# Patient Record
Sex: Male | Born: 1980 | Race: White | Hispanic: No | Marital: Single | Smoking: Current every day smoker
Health system: Southern US, Community
[De-identification: ages and names within clinical notes are randomized; demographics above are authoritative.]

## PROBLEM LIST (undated history)

## (undated) ENCOUNTER — Ambulatory Visit: Admission: EM | Source: Home / Self Care

## (undated) DIAGNOSIS — J45909 Unspecified asthma, uncomplicated: Secondary | ICD-10-CM

## (undated) DIAGNOSIS — L732 Hidradenitis suppurativa: Secondary | ICD-10-CM

## (undated) DIAGNOSIS — B958 Unspecified staphylococcus as the cause of diseases classified elsewhere: Secondary | ICD-10-CM

## (undated) DIAGNOSIS — I10 Essential (primary) hypertension: Secondary | ICD-10-CM

## (undated) HISTORY — PX: WISDOM TOOTH EXTRACTION: SHX21

---

## 2000-10-09 ENCOUNTER — Encounter: Payer: Self-pay | Admitting: Emergency Medicine

## 2000-10-09 ENCOUNTER — Emergency Department (HOSPITAL_COMMUNITY): Admission: EM | Admit: 2000-10-09 | Discharge: 2000-10-10 | Payer: Self-pay | Admitting: Emergency Medicine

## 2004-12-09 ENCOUNTER — Ambulatory Visit: Payer: Self-pay | Admitting: Internal Medicine

## 2004-12-09 ENCOUNTER — Ambulatory Visit (HOSPITAL_COMMUNITY): Admission: RE | Admit: 2004-12-09 | Discharge: 2004-12-09 | Payer: Self-pay | Admitting: Internal Medicine

## 2006-02-08 IMAGING — CR DG WRIST COMPLETE 3+V*L*
2 series · 2 of 2 positions shown · non-contrast
Comparison: None.

CLINICAL DATA: Left wrist pain.   Patient lifts heavy furniture. 
 LEFT WRIST ? 3 VIEW:

[view not recorded (1 of 2)]
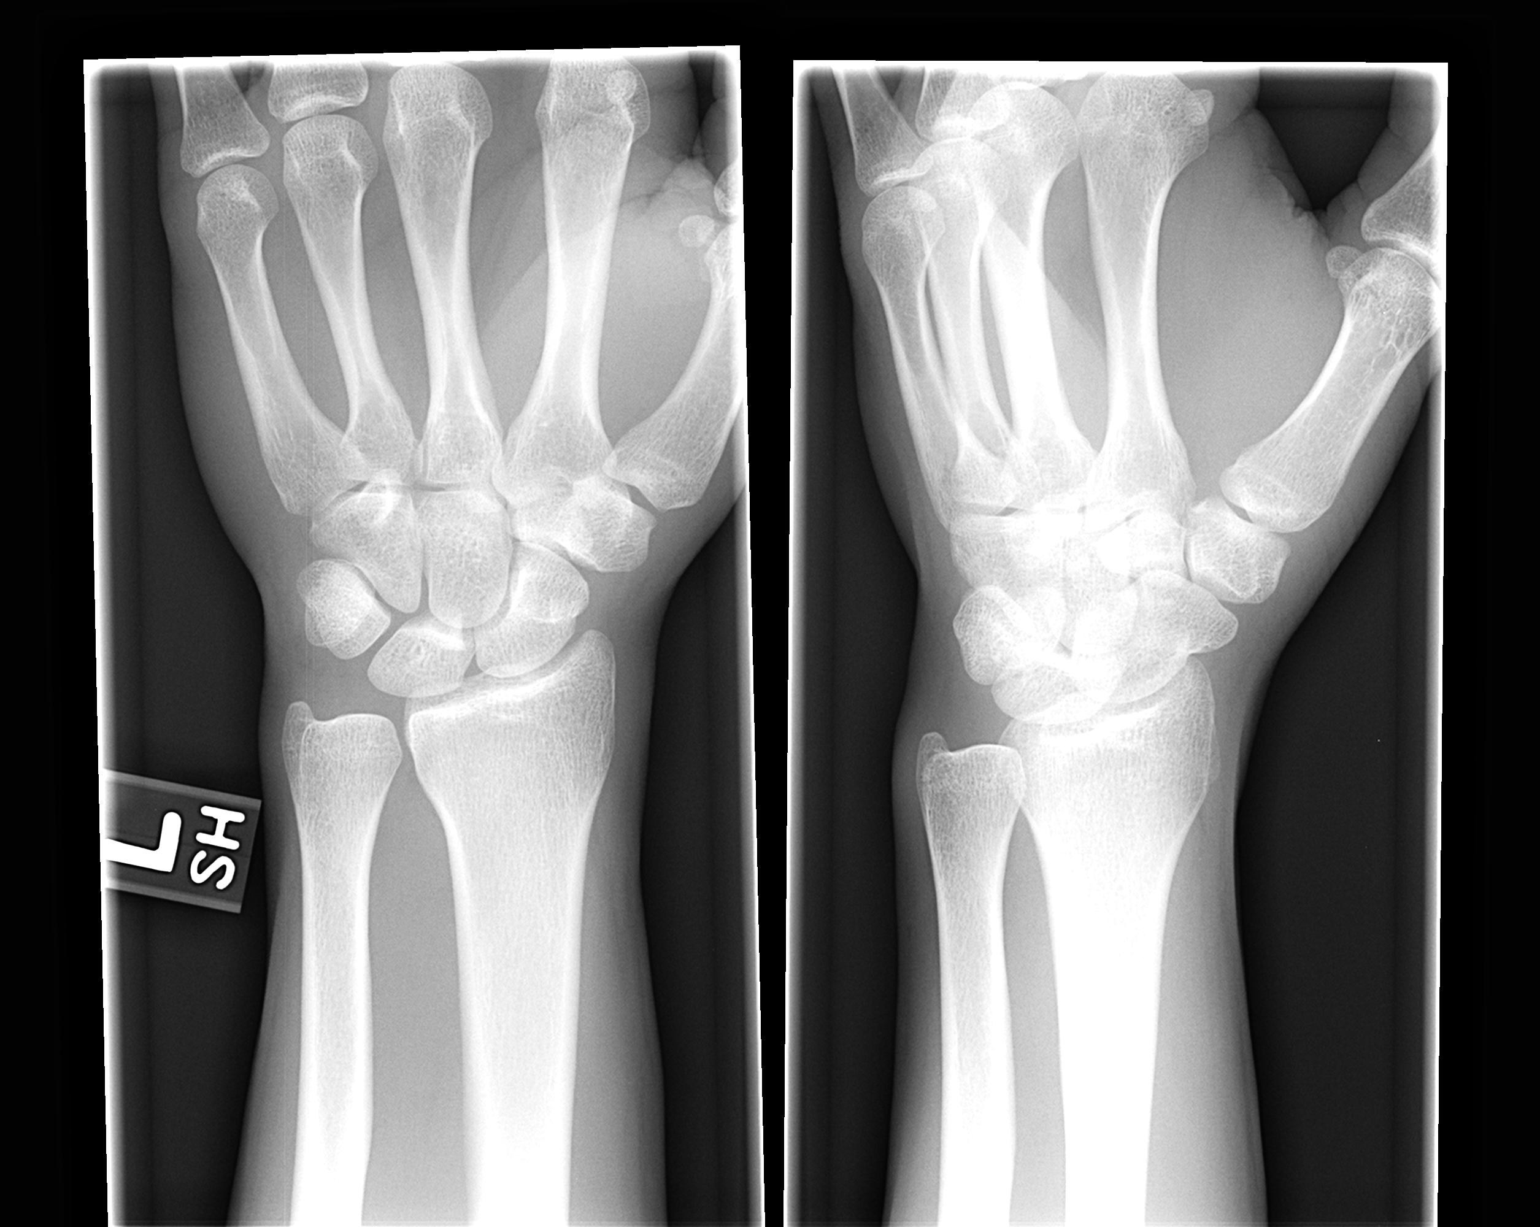

[view not recorded (2 of 2)]
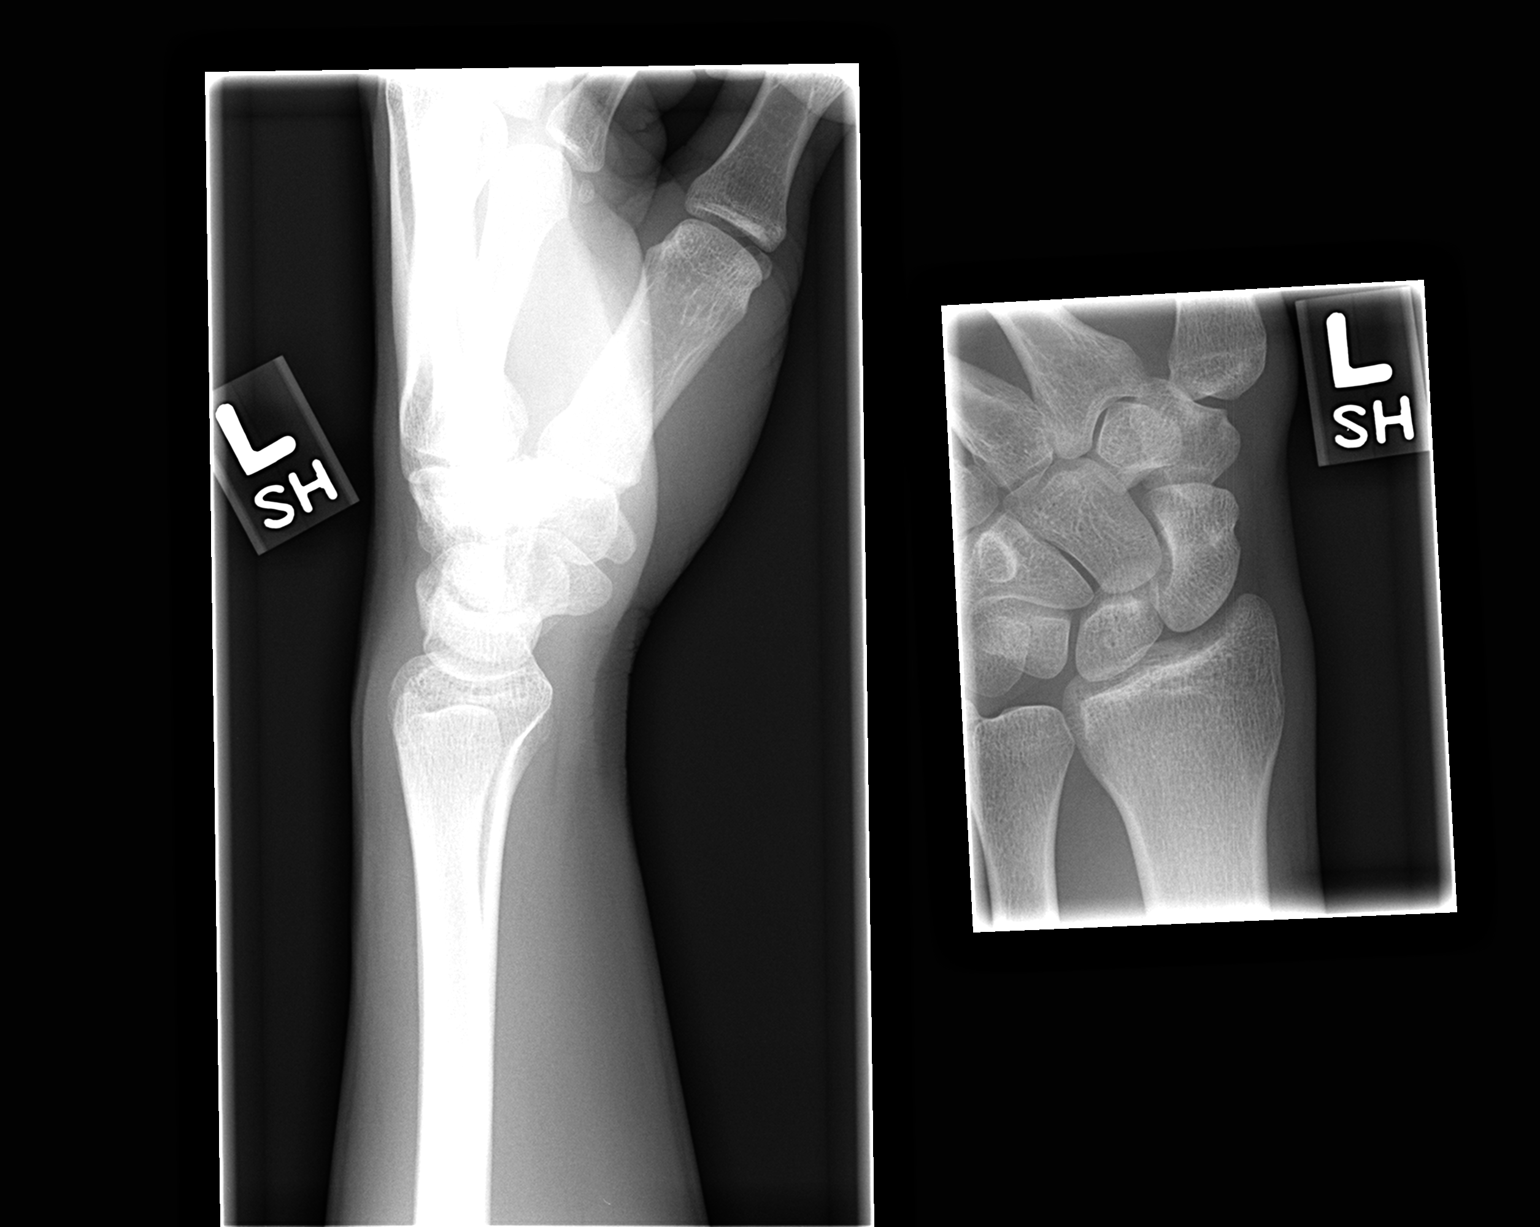

[2 of 2 positions shown; findings below may reference images not displayed]

FINDINGS: No acute radiographic abnormalities are noted.  Specifically, I see no fracture or dislocation.
IMPRESSION: No acute radiographic abnormalities.

## 2008-11-26 ENCOUNTER — Ambulatory Visit: Payer: Self-pay | Admitting: Internal Medicine

## 2008-11-26 DIAGNOSIS — F411 Generalized anxiety disorder: Secondary | ICD-10-CM | POA: Insufficient documentation

## 2008-11-26 DIAGNOSIS — E785 Hyperlipidemia, unspecified: Secondary | ICD-10-CM | POA: Insufficient documentation

## 2008-11-26 DIAGNOSIS — J309 Allergic rhinitis, unspecified: Secondary | ICD-10-CM | POA: Insufficient documentation

## 2009-08-24 ENCOUNTER — Telehealth: Payer: Self-pay | Admitting: Internal Medicine

## 2009-09-11 ENCOUNTER — Ambulatory Visit: Payer: Self-pay | Admitting: Internal Medicine

## 2009-09-11 DIAGNOSIS — M25519 Pain in unspecified shoulder: Secondary | ICD-10-CM

## 2009-12-16 ENCOUNTER — Telehealth: Payer: Self-pay | Admitting: Internal Medicine

## 2010-04-12 ENCOUNTER — Telehealth: Payer: Self-pay | Admitting: Internal Medicine

## 2010-04-20 NOTE — Progress Notes (Signed)
Summary: Med refills  Phone Note Refill Request  on August 24, 2009 2:34 PM  Refills Requested: Medication #1:  ALPRAZOLAM 0.25 MG TABS 1 by mouth two times a day as needed.   Dosage confirmed as above?Dosage Confirmed   Notes: [facility] [address] [phone] Initial call taken by: Scharlene Gloss,  August 24, 2009 2:34 PM  Follow-up for Phone Call        Rx faxed to pharmacy Follow-up by: Margaret Pyle, CMA,  August 24, 2009 3:11 PM    New/Updated Medications: ALPRAZOLAM 0.25 MG TABS (ALPRAZOLAM) 1 by mouth two times a day as needed - please make return office visit for further refills Prescriptions: ALPRAZOLAM 0.25 MG TABS (ALPRAZOLAM) 1 by mouth two times a day as needed - please make return office visit for further refills  #60 x 2   Entered and Authorized by:   Corwin Levins MD   Signed by:   Corwin Levins MD on 08/24/2009   Method used:   Print then Give to Patient   RxID:   [id number]  done hardcopy to LIM side B - dahlia Corwin Levins MD  August 24, 2009 2:49 PM

## 2010-04-20 NOTE — Progress Notes (Signed)
Summary: mediacation refill  Phone Note Refill Request Message from:  Fax from Pharmacy on December 16, 2009 3:50 PM  Refills Requested: Medication #1:  ALPRAZOLAM 0.5 MG TABS 1po three times a day as needed   Dosage confirmed as above?Dosage Confirmed   Last Refilled: 09/11/2009   Notes: [facility], [phone] Initial call taken by: Zella Ball Ewing CMA Duncan Dull),  December 16, 2009 3:51 PM  Follow-up for Phone Call        done hardcopy to LIM side B - dahlia  Follow-up by: Corwin Levins MD,  December 16, 2009 4:03 PM  Additional Follow-up for Phone Call Additional follow up Details #1::        Rx faxed to pharmacy Additional Follow-up by: Margaret Pyle, CMA,  December 16, 2009 4:07 PM    Prescriptions: ALPRAZOLAM 0.5 MG TABS (ALPRAZOLAM) 1po three times a day as needed  #90 x 2   Entered and Authorized by:   Corwin Levins MD   Signed by:   Corwin Levins MD on 12/16/2009   Method used:   Print then Give to Patient   RxID:   [id number]

## 2010-04-20 NOTE — Assessment & Plan Note (Signed)
Summary: SHOULDER PAIN /NWS  #   Vital Signs:  Patient profile:   30 year old male Height:      69 inches Weight:      182.25 pounds BMI:     27.01 O2 Sat:      96 % on Room air Temp:     98 degrees F oral Pulse rate:   59 / minute BP sitting:   110 / 70  (left arm) Cuff size:   regular  Vitals Entered By: Zella Ball Ewing CMA Duncan Dull) (September 11, 2009 4:24 PM)  O2 Flow:  Room air CC: Right Shoulder pain/RE   CC:  Right Shoulder pain/RE.  History of Present Illness: her wtih acute visit with c/o 2 wk increase right  shoulder pain , mild to mod, located right anterior shoulrder but nontender now to touch;  did seem somewhat tender to touch as well as the bicep area when started but now nontender , but pain persists, hurts worse to lie on the right side, worse to play Wii tennis, and just not better depstie 3 days of rest and light duty at work.  no neck apin or radicular symptoms,  no fever, ST, cough and Pt denies CP, sob, doe, wheezing, orthopnea, pnd, worsening LE edema, palps, dizziness or syncope Pt denies new neuro symptoms such as headache, facial or extremity weakness   Otherwise doing well except for increased stress at home and work without worsening depressive symtpoms, or suicidal ideatoin.  Despite current meds has been having increaseded anxiety, reduced work performance and meds help.  Curretnly unable to afford psychiatry. Is avesre to other med such as SSRI due to side effect of other family member.    Problems Prior to Update: 1)  Preventive Health Care  (ICD-V70.0) 2)  Hyperlipidemia  (ICD-272.4) 3)  Allergic Rhinitis  (ICD-477.9) 4)  Anxiety  (ICD-300.00)  Medications Prior to Update: 1)  Alprazolam 0.25 Mg Tabs (Alprazolam) .Marland Kitchen.. 1 By Mouth Two Times A Day As Needed - Please Make Return Office Visit For Further Refills  Current Medications (verified): 1)  Alprazolam 0.5 Mg Tabs (Alprazolam) .Marland Kitchen.. 1po Three Times A Day As Needed 2)  Tramadol Hcl 50 Mg Tabs (Tramadol Hcl)  .Marland Kitchen.. 1 By Mouth Q 6 Hrs As Needed Pain 3)  Prednisone 10 Mg Tabs (Prednisone) .... 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   Head:  normocephalic and atraumatic.   Eyes:  vision grossly intact, pupils equal, and pupils round.   Ears:  R ear normal and L ear normal.   Nose:  no external deformity and no nasal discharge.   Mouth:  no gingival abnormalities and pharynx pink and moist.   Neck:  supple and no masses.   Lungs:  normal respiratory effort and normal breath sounds.   Heart:  normal rate and regular rhythm.   Msk:  right shoudler  wtih FROM, NT, no swelling, erythema or rash;  spine nontender and no paravertebral spine tender Extremities:  no edema, no erythema  Neurologic:  strength normal in all upper extremities and sensation intact to light touch.     Impression & Recommendations:  Problem # 1:  SHOULDER PAIN, RIGHT (ICD-719.41)  His updated medication list for this problem includes:    Tramadol Hcl 50 Mg Tabs (Tramadol hcl) .Marland Kitchen... 1 by mouth q 6 hrs as needed pain prob deeper soft tissue  tendinosis; for pain med, prednisone pack, and f/u any worsening or persistent s/s  Problem # 2:  ANXIETY (ICD-300.00)  His updated medication list for this problem includes:    Alprazolam 0.5 Mg Tabs (Alprazolam) .Marland Kitchen... 1po three times a day as needed treat as  above, f/u any worsening signs or symptoms   Complete Medication List: 1)  Alprazolam 0.5 Mg Tabs (Alprazolam) .Marland Kitchen.. 1po three times a day as needed 2)  Tramadol Hcl 50 Mg Tabs (Tramadol hcl) .Marland Kitchen.. 1 by mouth q 6 hrs as needed pain 3)  Prednisone 10 Mg Tabs (Prednisone) .... 4po qd for 3days, then 3po qd for 3days, then 2po qd for 3days, then 1po qd for 3 days, then stop  Patient Instructions: 1)  Please take all new medications as prescribed 2)  Continue all previous medications as before this visit 3)  increse the alprazolam as prescribed  4)  Please schedule a follow-up appointment in 1 year or sooner if needed Prescriptions: PREDNISONE 10 MG TABS (PREDNISONE) 4po qd for 3days, then 3po qd for 3days, then 2po qd for 3days, then 1po qd for 3 days, then stop  #30 x 0   Entered and Authorized by:   Corwin Levins MD   Signed by:   Corwin Levins MD on 09/11/2009   Method used:   Print then Give to Patient   RxID:   [id number] TRAMADOL HCL 50 MG TABS (TRAMADOL HCL) 1 by mouth q 6 hrs as needed pain  #60 x 0   Entered and Authorized by:   Corwin Levins MD   Signed by:   Corwin Levins MD on 09/11/2009   Method used:   Print then Give to Patient   RxID:   [id number] ALPRAZOLAM 0.5 MG TABS (ALPRAZOLAM) 1po three times a day as needed  #90 x 2   Entered and Authorized by:   Corwin Levins MD   Signed by:   Corwin Levins MD on 09/11/2009   Method used:   Print then Give to Patient   RxID:   [id number]

## 2010-04-22 NOTE — Progress Notes (Signed)
  Phone Note Refill Request Message from:  Fax from Pharmacy on April 12, 2010 9:23 AM  Refills Requested: Medication #1:  ALPRAZOLAM 0.5 MG TABS 1po three times a day as needed   Dosage confirmed as above?Dosage Confirmed   Last Refilled: 12/16/2009   Notes: [facility] [phone] Initial call taken by: Robin Ewing CMA (AAMA),  April 12, 2010 9:24 AM    New/Updated Medications: ALPRAZOLAM 0.5 MG TABS (ALPRAZOLAM) 1po three times a day as needed Prescriptions: ALPRAZOLAM 0.5 MG TABS (ALPRAZOLAM) 1po three times a day as needed  #90 x 2   Entered and Authorized by:   Corwin Levins MD   Signed by:   Corwin Levins MD on 04/12/2010   Method used:   Print then Give to Patient   RxID:   [id number]  done hardcopy to LIM side B - dahlia Corwin Levins MD  April 12, 2010 12:22 PM   Rx faxed to pharmacy Margaret Pyle, CMA  April 12, 2010 1:19 PM

## 2010-08-27 ENCOUNTER — Other Ambulatory Visit: Payer: Self-pay

## 2010-08-27 MED ORDER — ALPRAZOLAM 0.5 MG PO TABS: 0.5000 mg | ORAL_TABLET | Freq: Three times a day (TID) | ORAL | Status: AC | PRN

## 2010-08-27 NOTE — Telephone Encounter (Signed)
Faxed hardcopy to pharmacy. 

## 2011-11-02 ENCOUNTER — Encounter (HOSPITAL_COMMUNITY): Payer: Self-pay | Admitting: Emergency Medicine

## 2011-11-02 ENCOUNTER — Emergency Department (HOSPITAL_COMMUNITY)
Admission: EM | Admit: 2011-11-02 | Discharge: 2011-11-02 | Disposition: A | Discharge status: Home / Self Care | Payer: Self-pay | Attending: Emergency Medicine | Admitting: Emergency Medicine

## 2011-11-02 DIAGNOSIS — F17200 Nicotine dependence, unspecified, uncomplicated: Secondary | ICD-10-CM | POA: Insufficient documentation

## 2011-11-02 DIAGNOSIS — R599 Enlarged lymph nodes, unspecified: Secondary | ICD-10-CM | POA: Insufficient documentation

## 2011-11-02 DIAGNOSIS — F411 Generalized anxiety disorder: Secondary | ICD-10-CM | POA: Insufficient documentation

## 2011-11-02 DIAGNOSIS — R591 Generalized enlarged lymph nodes: Secondary | ICD-10-CM

## 2011-11-02 DIAGNOSIS — IMO0002 Reserved for concepts with insufficient information to code with codable children: Secondary | ICD-10-CM | POA: Insufficient documentation

## 2011-11-02 DIAGNOSIS — L0390 Cellulitis, unspecified: Secondary | ICD-10-CM

## 2011-11-02 HISTORY — DX: Unspecified staphylococcus as the cause of diseases classified elsewhere: B95.8

## 2011-11-02 HISTORY — DX: Hidradenitis suppurativa: L73.2

## 2011-11-02 MED ORDER — CLINDAMYCIN HCL 150 MG PO CAPS: 300.0000 mg | ORAL_CAPSULE | Freq: Three times a day (TID) | ORAL | Status: AC

## 2011-11-02 MED ORDER — CLINDAMYCIN PHOSPHATE 600 MG/50ML IV SOLN
600.0000 mg | Freq: Once | INTRAVENOUS | Status: AC
  Administered 2011-11-02: 600 mg via INTRAVENOUS
  Filled 2011-11-02: qty 50

## 2011-11-02 MED ORDER — ACETAMINOPHEN-CODEINE #3 300-30 MG PO TABS
2.0000 | ORAL_TABLET | Freq: Once | ORAL | Status: AC
  Administered 2011-11-02: 2 via ORAL
  Filled 2011-11-02: qty 2

## 2011-11-02 MED ORDER — ACETAMINOPHEN-CODEINE #3 300-30 MG PO TABS: 1.0000 | ORAL_TABLET | Freq: Four times a day (QID) | ORAL | Status: AC | PRN

## 2011-11-02 NOTE — Discharge Instructions (Signed)
Cellulitis Cellulitis is an infection of the skin and the tissue beneath it. The area is typically red and tender. It is caused by germs (bacteria) (usually staph or strep) that enter the body through cuts or sores. Cellulitis most commonly occurs in the arms or lower legs.  HOME CARE INSTRUCTIONS   If you are given a prescription for medications which kill germs (antibiotics), take as directed until finished.   If the infection is on the arm or leg, keep the limb elevated as able.   Use a warm cloth several times per day to relieve pain and encourage healing.   See your caregiver for recheck of the infected site as directed if problems arise.   Only take over-the-counter or prescription medicines for pain, discomfort, or fever as directed by your caregiver.  SEEK MEDICAL CARE IF:   The area of redness (inflammation) is spreading, there are red streaks coming from the infected site, or if a part of the infection begins to turn dark in color.   The joint or bone underneath the infected skin becomes painful after the skin has healed.   The infection returns in the same or another area after it seems to have gone away.   A boil or bump swells up. This may be an abscess.   New, unexplained problems such as pain or fever develop.  SEEK IMMEDIATE MEDICAL CARE IF:   You have a fever.   You or your child feels drowsy or lethargic.   There is vomiting, diarrhea, or lasting discomfort or feeling ill (malaise) with muscle aches and pains.  MAKE SURE YOU:   Understand these instructions.   Will watch your condition.   Will get help right away if you are not doing well or get worse.  Document Released: 12/15/2004 Document Revised: 02/24/2011 Document Reviewed: 10/24/2007 Exit[employer] Patient Information 2012 [employer]. 

## 2011-11-02 NOTE — ED Notes (Signed)
C/o "lump" on L upper arm x 3 days.  Denies fever.

## 2011-11-02 NOTE — ED Provider Notes (Signed)
History   This chart was scribed for Gavin Pound. Oletta Lamas, MD by Charolett Bumpers . The patient was seen in room TR09C/TR09C. Patient's care was started at 1552.    CSN: [id number]  Arrival date & time 11/02/11  1552   First MD Initiated Contact with Patient 11/02/11 1732      Chief Complaint  Patient presents with  . Abscess    (Consider location/radiation/quality/duration/timing/severity/associated sxs/prior treatment) HPI Nicolas Taylor is a 31 y.o. male who presents to the Emergency Department complaining of constant, moderate, worsening redness to his inside left upper arm for the past 3 days. Pt reports associated mild pain to the location. Pt reports that he has associated numbness in fingers and 2 raised areas that appear to be lymph nodes. Pt reports a h/o staph infection and hydradenitis in which he had surgery for 6 years ago. Pt denies any fevers. Pt reports that he is otherwise healthy.   Past Medical History  Diagnosis Date  . Staph infection   . Hydradenitis     Past Surgical History  Procedure Date  . Wisdom tooth extraction     History reviewed. No pertinent family history.  History  Substance Use Topics  . Smoking status: Current Everyday Smoker  . Smokeless tobacco: Not on file  . Alcohol Use: Yes      Review of Systems  Constitutional: Negative for fever and chills.  Respiratory: Negative for shortness of breath.   Gastrointestinal: Negative for nausea and vomiting.  Musculoskeletal: Negative for joint swelling and arthralgias.  Skin: Positive for rash.       Redness to left upper arm with swollen lymph nodes.   Neurological: Positive for numbness. Negative for weakness.  All other systems reviewed and are negative.    Allergies  Review of patient's allergies indicates no known allergies.  Home Medications   Current Outpatient Rx  Name Route Sig Dispense Refill  . ALPRAZOLAM 0.5 MG PO TABS Oral Take 0.5 mg by mouth 3 (three) times  daily as needed. For anxiety    . ACETAMINOPHEN-CODEINE #3 300-30 MG PO TABS Oral Take 1-2 tablets by mouth every 6 (six) hours as needed for pain. 20 tablet 0  . CLINDAMYCIN HCL 150 MG PO CAPS Oral Take 2 capsules (300 mg total) by mouth 3 (three) times daily. 30 capsule 0    BP 137/90  Pulse 90  Temp 98.2 F (36.8 C) (Oral)  Resp 18  SpO2 98%  Physical Exam  Nursing note and vitals reviewed. Constitutional: He is oriented to person, place, and time. He appears well-developed and well-nourished. No distress.  HENT:  Head: Normocephalic and atraumatic.  Eyes: EOM are normal.  Neck: Neck supple. No tracheal deviation present.  Cardiovascular: Normal rate.   Pulmonary/Chest: Effort normal. No respiratory distress.  Musculoskeletal: Normal range of motion. He exhibits no tenderness.       Full ROM of left elbow.  Neurological: He is alert and oriented to person, place, and time.  Skin: Skin is warm and dry. There is erythema.       2 swollen lymph nodes in left upper arm, firm but mobile. One the size of peanut and other quarter size. Bump at tip of elbow. No purulent drainage.    Psychiatric: He has a normal mood and affect. His behavior is normal.    ED Course  Procedures (including critical care time)  DIAGNOSTIC STUDIES: Oxygen Saturation is 98% on room air, normal by my interpretation.  COORDINATION OF CARE:  17:53-Discussed planned course of treatment with the patient, who is agreeable at this time.   18:00-Medication Orders: Clindamycin (Cleocin) IVPB 600 mg-once  Labs Reviewed - No data to display No results found.   1. Cellulitis   2. Lymphadenopathy       MDM  I personally performed the services described in this documentation, which was scribed in my presence. The recorded information has been reviewed and considered.    Pt with mild local cellulitis, indurated regions that feel more like lymphadenopathy rather than abscess.  Pt with h/o hydradenitis  suppireva in the past with both axillary lymph nodes surgically removed about 6 years ago.  Will give IV dose of clindamycin and oral Rx as well as Codeine Rx.  Pt can follow up with PCP next week to ensure improvement.        Gavin Pound. Oletta Lamas, MD 11/02/11 1191

## 2012-05-21 ENCOUNTER — Encounter (HOSPITAL_COMMUNITY): Payer: Self-pay | Admitting: *Deleted

## 2012-05-21 ENCOUNTER — Emergency Department (HOSPITAL_COMMUNITY)
Admission: EM | Admit: 2012-05-21 | Discharge: 2012-05-21 | Disposition: A | Discharge status: Home / Self Care | Payer: Self-pay | Attending: Emergency Medicine | Admitting: Emergency Medicine

## 2012-05-21 DIAGNOSIS — K5289 Other specified noninfective gastroenteritis and colitis: Secondary | ICD-10-CM | POA: Insufficient documentation

## 2012-05-21 DIAGNOSIS — Z872 Personal history of diseases of the skin and subcutaneous tissue: Secondary | ICD-10-CM | POA: Insufficient documentation

## 2012-05-21 DIAGNOSIS — R05 Cough: Secondary | ICD-10-CM | POA: Insufficient documentation

## 2012-05-21 DIAGNOSIS — R109 Unspecified abdominal pain: Secondary | ICD-10-CM | POA: Insufficient documentation

## 2012-05-21 DIAGNOSIS — K529 Noninfective gastroenteritis and colitis, unspecified: Secondary | ICD-10-CM

## 2012-05-21 DIAGNOSIS — R197 Diarrhea, unspecified: Secondary | ICD-10-CM | POA: Insufficient documentation

## 2012-05-21 DIAGNOSIS — R059 Cough, unspecified: Secondary | ICD-10-CM | POA: Insufficient documentation

## 2012-05-21 DIAGNOSIS — R509 Fever, unspecified: Secondary | ICD-10-CM | POA: Insufficient documentation

## 2012-05-21 DIAGNOSIS — F17200 Nicotine dependence, unspecified, uncomplicated: Secondary | ICD-10-CM | POA: Insufficient documentation

## 2012-05-21 DIAGNOSIS — R52 Pain, unspecified: Secondary | ICD-10-CM | POA: Insufficient documentation

## 2012-05-21 DIAGNOSIS — Z8619 Personal history of other infectious and parasitic diseases: Secondary | ICD-10-CM | POA: Insufficient documentation

## 2012-05-21 LAB — CBC WITH DIFFERENTIAL/PLATELET
Basophils Absolute: 0 10*3/uL (ref 0.0–0.1)
Eosinophils Relative: 0 % (ref 0–5)
HCT: 48.5 % (ref 39.0–52.0)
Lymphocytes Relative: 15 % (ref 12–46)
MCH: 31.2 pg (ref 26.0–34.0)
MCV: 85.8 fL (ref 78.0–100.0)
Monocytes Absolute: 0.7 10*3/uL (ref 0.1–1.0)
RDW: 12.7 % (ref 11.5–15.5)
WBC: 11.1 10*3/uL — ABNORMAL HIGH (ref 4.0–10.5)

## 2012-05-21 LAB — COMPREHENSIVE METABOLIC PANEL
AST: 22 U/L (ref 0–37)
CO2: 27 mEq/L (ref 19–32)
Calcium: 11 mg/dL — ABNORMAL HIGH (ref 8.4–10.5)
Creatinine, Ser: 1.01 mg/dL (ref 0.50–1.35)
GFR calc Af Amer: >90 mL/min (ref >90)
GFR calc non Af Amer: >90 mL/min (ref >90)
Glucose, Bld: 114 mg/dL — ABNORMAL HIGH (ref 70–99)

## 2012-05-21 MED ORDER — ONDANSETRON HCL 8 MG PO TABS: 8.0000 mg | ORAL_TABLET | Freq: Three times a day (TID) | ORAL | Status: DC | PRN

## 2012-05-21 NOTE — ED Notes (Signed)
PT is here with vomiting diarrhea sorethroat and states that it has been going on since Friday.  Pt reports headache

## 2012-05-21 NOTE — ED Provider Notes (Signed)
History     CSN: [id number]  Arrival date & time 05/21/12  1328   First MD Initiated Contact with Patient 05/21/12 1553      No chief complaint on file.   (Consider location/radiation/quality/duration/timing/severity/associated sxs/prior treatment) HPI Comments: Nicolas Taylor is a 32 y.o. male who was been ill for 3 days with fever, nausea, vomiting, diarrhea, and achiness. He has occasional intermittent abdominal cramping. He denies this or dizziness. He has had occasional cough without shortness of breath. He has been able to tolerate some food, including a sandwich, and drink some fluids. There are no modifying factors.  The history is provided by the patient.    Past Medical History  Diagnosis Date  . Staph infection   . Hydradenitis     Past Surgical History  Procedure Laterality Date  . Wisdom tooth extraction      No family history on file.  History  Substance Use Topics  . Smoking status: Current Every Day Smoker  . Smokeless tobacco: Not on file  . Alcohol Use: Yes      Review of Systems  All other systems reviewed and are negative.    Allergies  Review of patient's allergies indicates no known allergies.  Home Medications   Current Outpatient Rx  Name  Route  Sig  Dispense  Refill  . ondansetron (ZOFRAN) 8 MG tablet   Oral   Take 1 tablet (8 mg total) by mouth every 8 (eight) hours as needed for nausea.   20 tablet   0     BP 130/85  Pulse 117  Temp(Src) 97.7 F (36.5 C) (Oral)  Resp 20  SpO2 98%  Physical Exam  Nursing note and vitals reviewed. Constitutional: He is oriented to person, place, and time. He appears well-developed and well-nourished.  HENT:  Head: Normocephalic and atraumatic.  Right Ear: External ear normal.  Left Ear: External ear normal.  Eyes: Conjunctivae and EOM are normal. Pupils are equal, round, and reactive to light.  Neck: Normal range of motion and phonation normal. Neck supple.  Cardiovascular: Normal  rate, regular rhythm, normal heart sounds and intact distal pulses.   Pulmonary/Chest: Effort normal and breath sounds normal. He exhibits no bony tenderness.  Abdominal: Soft. Normal appearance. He exhibits no distension. There is no tenderness. There is no guarding.  Musculoskeletal: Normal range of motion.  Neurological: He is alert and oriented to person, place, and time. He has normal strength. No cranial nerve deficit or sensory deficit. He exhibits normal muscle tone. Coordination normal.  Skin: Skin is warm, dry and intact.  Psychiatric: He has a normal mood and affect. His behavior is normal. Judgment and thought content normal.    ED Course  Procedures (including critical care time)  Labs Reviewed  CBC WITH DIFFERENTIAL - Abnormal; Notable for the following:    WBC 11.1 (*)    Hemoglobin 17.6 (*)    MCHC 36.3 (*)    Neutrophils Relative 79 (*)    Neutro Abs 8.8 (*)    All other components within normal limits  COMPREHENSIVE METABOLIC PANEL  URINALYSIS, ROUTINE W REFLEX MICROSCOPIC    Nursing notes, applicable records and vitals reviewed.  Radiologic Images/Reports reviewed.   1. Gastroenteritis       MDM  And symptoms of gastroenteritis; cannot rule out food toxin or bacterial enteritis. Doubt metabolic instability, serious bacterial infection or impending vascular collapse; the patient is stable for discharge.     Plan: Home Medications- Zofran; Home Treatments-  Gradually advance diet. Work release for 3 days; Recommended follow up- PCP prn   Flint Melter, MD 05/21/12 1616

## 2013-04-26 ENCOUNTER — Ambulatory Visit (INDEPENDENT_AMBULATORY_CARE_PROVIDER_SITE_OTHER): Payer: Self-pay | Admitting: Internal Medicine

## 2013-04-26 ENCOUNTER — Encounter: Payer: Self-pay | Admitting: Internal Medicine

## 2013-04-26 VITALS — BP 132/82 | HR 89 | Temp 98.2°F | Ht 69.0 in | Wt 176.0 lb

## 2013-04-26 DIAGNOSIS — F1110 Opioid abuse, uncomplicated: Secondary | ICD-10-CM

## 2013-04-26 DIAGNOSIS — F1190 Opioid use, unspecified, uncomplicated: Secondary | ICD-10-CM | POA: Insufficient documentation

## 2013-04-26 NOTE — Patient Instructions (Signed)
You are given the list of MD's in [place] who prescribe suboxone.  Please also consider [facility], and [facility] options for medical detox as well

## 2013-04-26 NOTE — Progress Notes (Signed)
   Subjective:    Patient ID: Nicolas Taylor, male    DOB: 12/14/1980, 33 y.o.   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 Past Medical History  Diagnosis Date  . Staph infection   . Hydradenitis    Past Surgical History  Procedure Laterality Date  . Wisdom tooth extraction      reports that he has been smoking.  He does not have any smokeless tobacco history on file. He reports that he drinks alcohol. He reports that he uses illicit drugs (Marijuana). family history is not on file. No Known Allergies Current Outpatient Prescriptions on File Prior to Visit  Medication Sig Dispense Refill  . ondansetron (ZOFRAN) 8 MG tablet Take 1 tablet (8 mg total) by mouth every 8 (eight) hours as needed for nausea.  20 tablet  0   No current facility-administered medications on file prior to visit.    Review of Systems All otherwise neg per pt     Objective:   Physical Exam BP 132/82  Pulse 89  Temp(Src) 98.2 F (36.8 C) (Oral)  Ht 5\' 9"  (1.753 m)  Wt 176 lb (79.833 kg)  BMI 25.98 kg/m2  SpO2 94% VS noted,  Constitutional: Pt appears well-developed and well-nourished.  HENT: Head: NCAT.  Right Ear: External ear normal.  Left Ear: External ear normal.  Eyes: Conjunctivae and EOM are normal. Pupils are equal, round, and reactive to light.  Neck: Normal range of motion. Neck supple.  Cardiovascular: Normal rate and regular rhythm.   Pulmonary/Chest: Effort normal and breath sounds normal.        Assessment & Plan:

## 2013-04-26 NOTE — Progress Notes (Signed)
Pre-visit discussion using our clinic review tool. No additional management support is needed unless otherwise documented below in the visit note.  

## 2013-04-26 NOTE — Assessment & Plan Note (Signed)
D/w pt, I am unable to prescribe suboxone as I am not certified to do this;  I did look up list of MD's in [place] that do prescribe this, as well as information regarding [facility] (private detox tx) and [facility].

## 2023-09-15 ENCOUNTER — Ambulatory Visit: Admission: EM | Admit: 2023-09-15 | Discharge: 2023-09-15 | Disposition: A | Discharge status: Home / Self Care

## 2023-09-15 VITALS — BP 155/98 | HR 78 | Temp 98.0°F | Resp 17

## 2023-09-15 DIAGNOSIS — J069 Acute upper respiratory infection, unspecified: Secondary | ICD-10-CM

## 2023-09-15 DIAGNOSIS — R062 Wheezing: Secondary | ICD-10-CM

## 2023-09-15 DIAGNOSIS — R0602 Shortness of breath: Secondary | ICD-10-CM | POA: Diagnosis not present

## 2023-09-15 HISTORY — DX: Unspecified asthma, uncomplicated: J45.909

## 2023-09-15 HISTORY — DX: Essential (primary) hypertension: I10

## 2023-09-15 MED ORDER — METHYLPREDNISOLONE ACETATE 80 MG/ML IJ SUSP
80.0000 mg | Freq: Once | INTRAMUSCULAR | Status: AC
  Administered 2023-09-15: 80 mg via INTRAMUSCULAR

## 2023-09-15 MED ORDER — PREDNISONE 20 MG PO TABS: ORAL_TABLET | ORAL | 0 refills | Status: AC

## 2023-09-15 MED ORDER — IPRATROPIUM-ALBUTEROL 0.5-2.5 (3) MG/3ML IN SOLN
3.0000 mL | RESPIRATORY_TRACT | Status: AC
  Administered 2023-09-15: 3 mL via RESPIRATORY_TRACT

## 2023-09-15 MED ORDER — AMOXICILLIN-POT CLAVULANATE 875-125 MG PO TABS: 1.0000 | ORAL_TABLET | Freq: Two times a day (BID) | ORAL | 0 refills | Status: AC

## 2023-09-15 NOTE — ED Provider Notes (Signed)
 [facility]    CSN: [id number] Arrival date & time: 09/15/23  1517      History   Chief Complaint Chief Complaint  Patient presents with   Wheezing   Shortness of Breath    HPI Nicolas Taylor is a 43 y.o. male.   HPI Pleasant 43 year old male presents with wheezing and shortness of breath x 1 week.  Patient reports dull ache in left side of chest and it hurts to breathe.  Patient reports history of pneumonia.  PMH significant for asthma, HTN, and heroin use.  Past Medical History:  Diagnosis Date   Asthma    Hydradenitis    Hypertension    Staph infection     Patient Active Problem List   Diagnosis Date Noted   Heroin use 04/26/2013   HYPERLIPIDEMIA 11/26/2008   ANXIETY 11/26/2008   ALLERGIC RHINITIS 11/26/2008    Past Surgical History:  Procedure Laterality Date   WISDOM TOOTH EXTRACTION         Home Medications    Prior to Admission medications   Medication Sig Start Date End Date Taking? Authorizing Provider  albuterol (VENTOLIN HFA) 108 (90 Base) MCG/ACT inhaler Inhale 2 puffs into the lungs every 6 (six) hours as needed. 08/27/23  Yes [provider]  amoxicillin-clavulanate (AUGMENTIN) 875-125 MG tablet Take 1 tablet by mouth every 12 (twelve) hours. 09/15/23  Yes Teddy Sharper, FNP  amphetamine-dextroamphetamine (ADDERALL XR) 20 MG 24 hr capsule Take 20 mg by mouth every morning. 08/30/23  Yes [provider]  augmented betamethasone dipropionate (DIPROLENE-AF) 0.05 % ointment Apply topically 2 (two) times daily. 07/28/23  Yes [provider]  Buprenorphine HCl-Naloxone HCl 8-2 MG FILM Place under the tongue 2 (two) times daily. 09/06/23  Yes [provider]  cloNIDine (CATAPRES) 0.1 MG tablet Take 0.1 mg by mouth 2 (two) times daily. 07/28/23  Yes [provider]  montelukast (SINGULAIR) 10 MG tablet Take 10 mg by mouth daily. 08/27/23  Yes [provider]  predniSONE (DELTASONE) 20 MG tablet  Take 3 tabs PO daily x 5 days. 09/15/23  Yes Teddy Sharper, FNP  sildenafil (REVATIO) 20 MG tablet SMARTSIG:3-5 Tablet(s) By Mouth 08/30/23  Yes [provider]  testosterone cypionate (DEPOTESTOSTERONE CYPIONATE) 200 MG/ML injection Inject 200 mg into the muscle once a week. 08/11/23  Yes [provider]    Family History History reviewed. No pertinent family history.  Social History Social History   Tobacco Use   Smoking status: Every Day  Substance Use Topics   Alcohol use: Yes   Drug use: Yes    Types: Marijuana     Allergies   Patient has no known allergies.   Review of Systems Review of Systems  Respiratory:  Positive for shortness of breath and wheezing.   All other systems reviewed and are negative.    Physical Exam Triage Vital Signs ED Triage Vitals [09/15/23 1527]  Encounter Vitals Group     BP (!) 155/98     Girls Systolic BP Percentile      Girls Diastolic BP Percentile      Boys Systolic BP Percentile      Boys Diastolic BP Percentile      Pulse Rate 78     Resp 17     Temp 98 F (36.7 C)     Temp Source Oral     SpO2 98 %     Weight      Height  Head Circumference      Peak Flow      Pain Score 7     Pain Loc      Pain Education      Exclude from Growth Chart    No data found.  Updated Vital Signs BP (!) 155/98 (BP Location: Right Arm)   Pulse 78   Temp 98 F (36.7 C) (Oral)   Resp 17   SpO2 98%    Physical Exam Vitals and nursing note reviewed.  Constitutional:      Appearance: Normal appearance. He is normal weight.  HENT:     Head: Normocephalic and atraumatic.     Right Ear: Tympanic membrane, ear canal and external ear normal.     Left Ear: Tympanic membrane, ear canal and external ear normal.     Mouth/Throat:     Mouth: Mucous membranes are moist.     Pharynx: Oropharynx is clear.   Eyes:     Extraocular Movements: Extraocular movements intact.     Conjunctiva/sclera: Conjunctivae normal.      Pupils: Pupils are equal, round, and reactive to light.    Cardiovascular:     Rate and Rhythm: Normal rate and regular rhythm.     Pulses: Normal pulses.     Heart sounds: Normal heart sounds.  Pulmonary:     Effort: Pulmonary effort is normal.     Breath sounds: No rhonchi or rales.     Comments: Mild inspiratory wheezes noted throughout  Post: IM Depo-Medrol 80 mg/DuoNeb: Improved air intake bibasilarly, no wheezing  Musculoskeletal:        General: Normal range of motion.   Skin:    General: Skin is warm and dry.   Neurological:     General: No focal deficit present.     Mental Status: He is alert and oriented to person, place, and time. Mental status is at baseline.   Psychiatric:        Mood and Affect: Mood normal.        Behavior: Behavior normal.      UC Treatments / Results  Labs (all labs ordered are listed, but only abnormal results are displayed) Labs Reviewed - No data to display  EKG   Radiology No results found.  Procedures Procedures (including critical care time)  Medications Ordered in UC Medications  ipratropium-albuterol (DUONEB) 0.5-2.5 (3) MG/3ML nebulizer solution 3 mL (3 mLs Nebulization Given 09/15/23 1600)  methylPREDNISolone acetate (DEPO-MEDROL) injection 80 mg (80 mg Intramuscular Given 09/15/23 1600)    Initial Impression / Assessment and Plan / UC Course  I have reviewed the triage vital signs and the nursing notes.  Pertinent labs & imaging results that were available during my care of the patient were reviewed by me and considered in my medical decision making (see chart for details).     MDM: 1.  Shortness of breath-IM Depo-Medrol 80 mg given once in clinic, DuoNeb 0.5-2.53 mg / 3 mL nebulizer given once in clinic; 2.  Acute URI-Rx'd Augmentin 875/125 mg tablet: Take 1 tablet twice daily x 7 days; 3.  Wheezing-Rx'd prednisone 60 mg tablet: Take 3 tablets p.o. daily x 5 days. Advised patient take medications as directed with  food to completion.  Advised patient to take prednisone with first dose of Augmentin for the next 5 of 7 days.  Encouraged increase daily water intake to 64 ounces per day while taking these medications.  Advised if symptoms worsen and/or unresolved please follow-up with your PCP  or here for further evaluation.  Patient discharged home, hemodynamically stable Final Clinical Impressions(s) / UC Diagnoses   Final diagnoses:  Shortness of breath  Acute URI  Wheezing     Discharge Instructions      Advised patient take medications as directed with food to completion.  Advised patient to take prednisone with first dose of Augmentin for the next 5 of 7 days.  Encouraged increase daily water intake to 64 ounces per day while taking these medications.  Advised if symptoms worsen and/or unresolved please follow-up with your PCP or here for further evaluation.     ED Prescriptions     Medication Sig Dispense Auth. Provider   predniSONE (DELTASONE) 20 MG tablet Take 3 tabs PO daily x 5 days. 15 tablet Yelitza Reach, FNP   amoxicillin-clavulanate (AUGMENTIN) 875-125 MG tablet Take 1 tablet by mouth every 12 (twelve) hours. 14 tablet Miraya Cudney, FNP      PDMP not reviewed this encounter.   Teddy Sharper, FNP 09/15/23 1630

## 2023-09-15 NOTE — ED Triage Notes (Addendum)
 Pt c/o shortness of breath and wheezing x 1 week. Says it starter with a dull ache in LT side of chest. Hurts to breathe. Hx of pneumonia.

## 2023-09-15 NOTE — Discharge Instructions (Addendum)
 Advised patient take medications as directed with food to completion.  Advised patient to take prednisone with first dose of Augmentin for the next 5 of 7 days.  Encouraged increase daily water intake to 64 ounces per day while taking these medications.  Advised if symptoms worsen and/or unresolved please follow-up with your PCP or here for further evaluation.
